# Patient Record
Sex: Male | Born: 1995 | Race: Black or African American | Marital: Single
Health system: Southern US, Community
[De-identification: ages and names within clinical notes are randomized; demographics above are authoritative.]

---

## 2017-02-24 ENCOUNTER — Ambulatory Visit (HOSPITAL_COMMUNITY)

## 2017-02-24 ENCOUNTER — Encounter (HOSPITAL_COMMUNITY): Payer: Self-pay | Admitting: *Deleted

## 2017-02-24 ENCOUNTER — Ambulatory Visit (HOSPITAL_COMMUNITY)
Admission: EM | Admit: 2017-02-24 | Discharge: 2017-02-24 | Disposition: A | Discharge status: Home / Self Care | Attending: Internal Medicine | Admitting: Internal Medicine

## 2017-02-24 ENCOUNTER — Ambulatory Visit (INDEPENDENT_AMBULATORY_CARE_PROVIDER_SITE_OTHER)

## 2017-02-24 DIAGNOSIS — M25571 Pain in right ankle and joints of right foot: Secondary | ICD-10-CM

## 2017-02-24 NOTE — Discharge Instructions (Signed)
I recommend rest, ice, compression of the ankle, elevation, and management of your pain with over-the-counter medicine such as Tylenol or ibuprofen. I have provided a work note, should your symptoms persist, follow-up with her primary care provider or return to clinic as needed.

## 2017-02-24 NOTE — ED Triage Notes (Signed)
inj  r  Ankle  Two  Months  Ago  Hit  It  On a  Table       Did  Not  reinjurr  It    Pt  Reports  Pain on  Weight bearing  /  Running

## 2017-02-24 NOTE — ED Provider Notes (Signed)
CSN: [id number]     Arrival date & time 02/24/17  1416 History   None    Chief Complaint  Patient presents with  . Ankle Pain   (Consider location/radiation/quality/duration/timing/severity/associated sxs/prior Treatment) 21 year old male presents for evaluation of right ankle pain ongoing for approximately 2 months.   The history is provided by the patient.  Ankle Pain  Location:  Ankle Time since incident:  2 months Injury: yes   Mechanism of injury comment:  Twisting Ankle location:  R ankle Pain details:    Quality:  Aching, dull and sharp   Radiates to:  Does not radiate   Severity:  Moderate   Onset quality:  Sudden   Duration:  2 months   Timing:  Intermittent   Progression:  Improving Chronicity:  New Dislocation: no   Foreign body present:  No foreign bodies Prior injury to area:  No Relieved by:  Acetaminophen, compression, heat, elevation and NSAIDs Worsened by:  Bearing weight and exercise Associated symptoms: no back pain, no decreased ROM, no fatigue, no fever, no muscle weakness, no swelling and no tingling   Risk factors: no concern for non-accidental trauma, no frequent fractures, no known bone disorder, no obesity and no recent illness     History reviewed. No pertinent past medical history. History reviewed. No pertinent surgical history. History reviewed. No pertinent family history. Social History  Substance Use Topics  . Smoking status: Not on file  . Smokeless tobacco: Not on file  . Alcohol use Yes     Comment: socially    Review of Systems  Constitutional: Negative for fatigue and fever.  Musculoskeletal: Negative for back pain.  All other systems reviewed and are negative.   Allergies  Patient has no known allergies.  Home Medications   Prior to Admission medications   Not on File   Meds Ordered and Administered this Visit  Medications - No data to display  BP 110/70 (BP Location: Right Arm)   Pulse 78   Temp 98.6 F (37 C)  (Oral)   Resp 18   SpO2 100%  No data found.   Physical Exam  Constitutional: He is oriented to person, place, and time. He appears well-developed and well-nourished. No distress.  HENT:  Head: Normocephalic and atraumatic.  Right Ear: External ear normal.  Left Ear: External ear normal.  Musculoskeletal: He exhibits tenderness. He exhibits no edema or deformity.       Right ankle: He exhibits normal range of motion, no swelling, no deformity, no laceration and normal pulse. Tenderness. Lateral malleolus tenderness found. No medial malleolus, no AITFL, no posterior TFL and no head of 5th metatarsal tenderness found. Achilles tendon exhibits no pain.  Neurological: He is alert and oriented to person, place, and time.  Skin: Skin is warm and dry. Capillary refill takes less than 2 seconds. No rash noted. He is not diaphoretic. No erythema.  Psychiatric: He has a normal mood and affect. His behavior is normal.  Nursing note and vitals reviewed.   Urgent Care Course     Procedures (including critical care time)  Labs Review Labs Reviewed - No data to display  Imaging Review Dg Ankle Complete Right  Result Date: 02/24/2017 CLINICAL DATA:  Right ankle pain since an injury wrestling in January, 2018. Initial encounter. EXAM: RIGHT ANKLE - COMPLETE 3+ VIEW COMPARISON:  None. FINDINGS: There is no evidence of fracture, dislocation, or joint effusion. There is no evidence of arthropathy or other focal bone abnormality. Soft tissues  are unremarkable. IMPRESSION: Normal exam. Electronically Signed   By: Drusilla Kannerhomas  Dalessio M.D.   On: 02/24/2017 15:45       MDM   1. Acute right ankle pain     No evidence of fracture or other abnormal findings on x-ray. Advise rest, ice, compression, elevation, pain management with over-the-counter medicines. Work note provided, follow-up with primary care should symptoms persist.    Dorena BodoLawrence Caylyn Tedeschi, NP 02/24/17 1611

## 2019-05-31 ENCOUNTER — Other Ambulatory Visit: Payer: Self-pay | Admitting: Nurse Practitioner

## 2019-05-31 ENCOUNTER — Ambulatory Visit
Admission: RE | Admit: 2019-05-31 | Discharge: 2019-05-31 | Disposition: A | Discharge status: Home / Self Care | Payer: No Typology Code available for payment source | Source: Ambulatory Visit | Attending: Nurse Practitioner | Admitting: Nurse Practitioner

## 2019-05-31 DIAGNOSIS — Z021 Encounter for pre-employment examination: Secondary | ICD-10-CM

## 2019-12-04 IMAGING — CR CHEST  1 VIEW
1 series · 1 of 1 positions shown · non-contrast
Comparison: None.

CLINICAL DATA: Pre-employment chest x-ray.

EXAM:
CHEST  1 VIEW

[w chest pa]
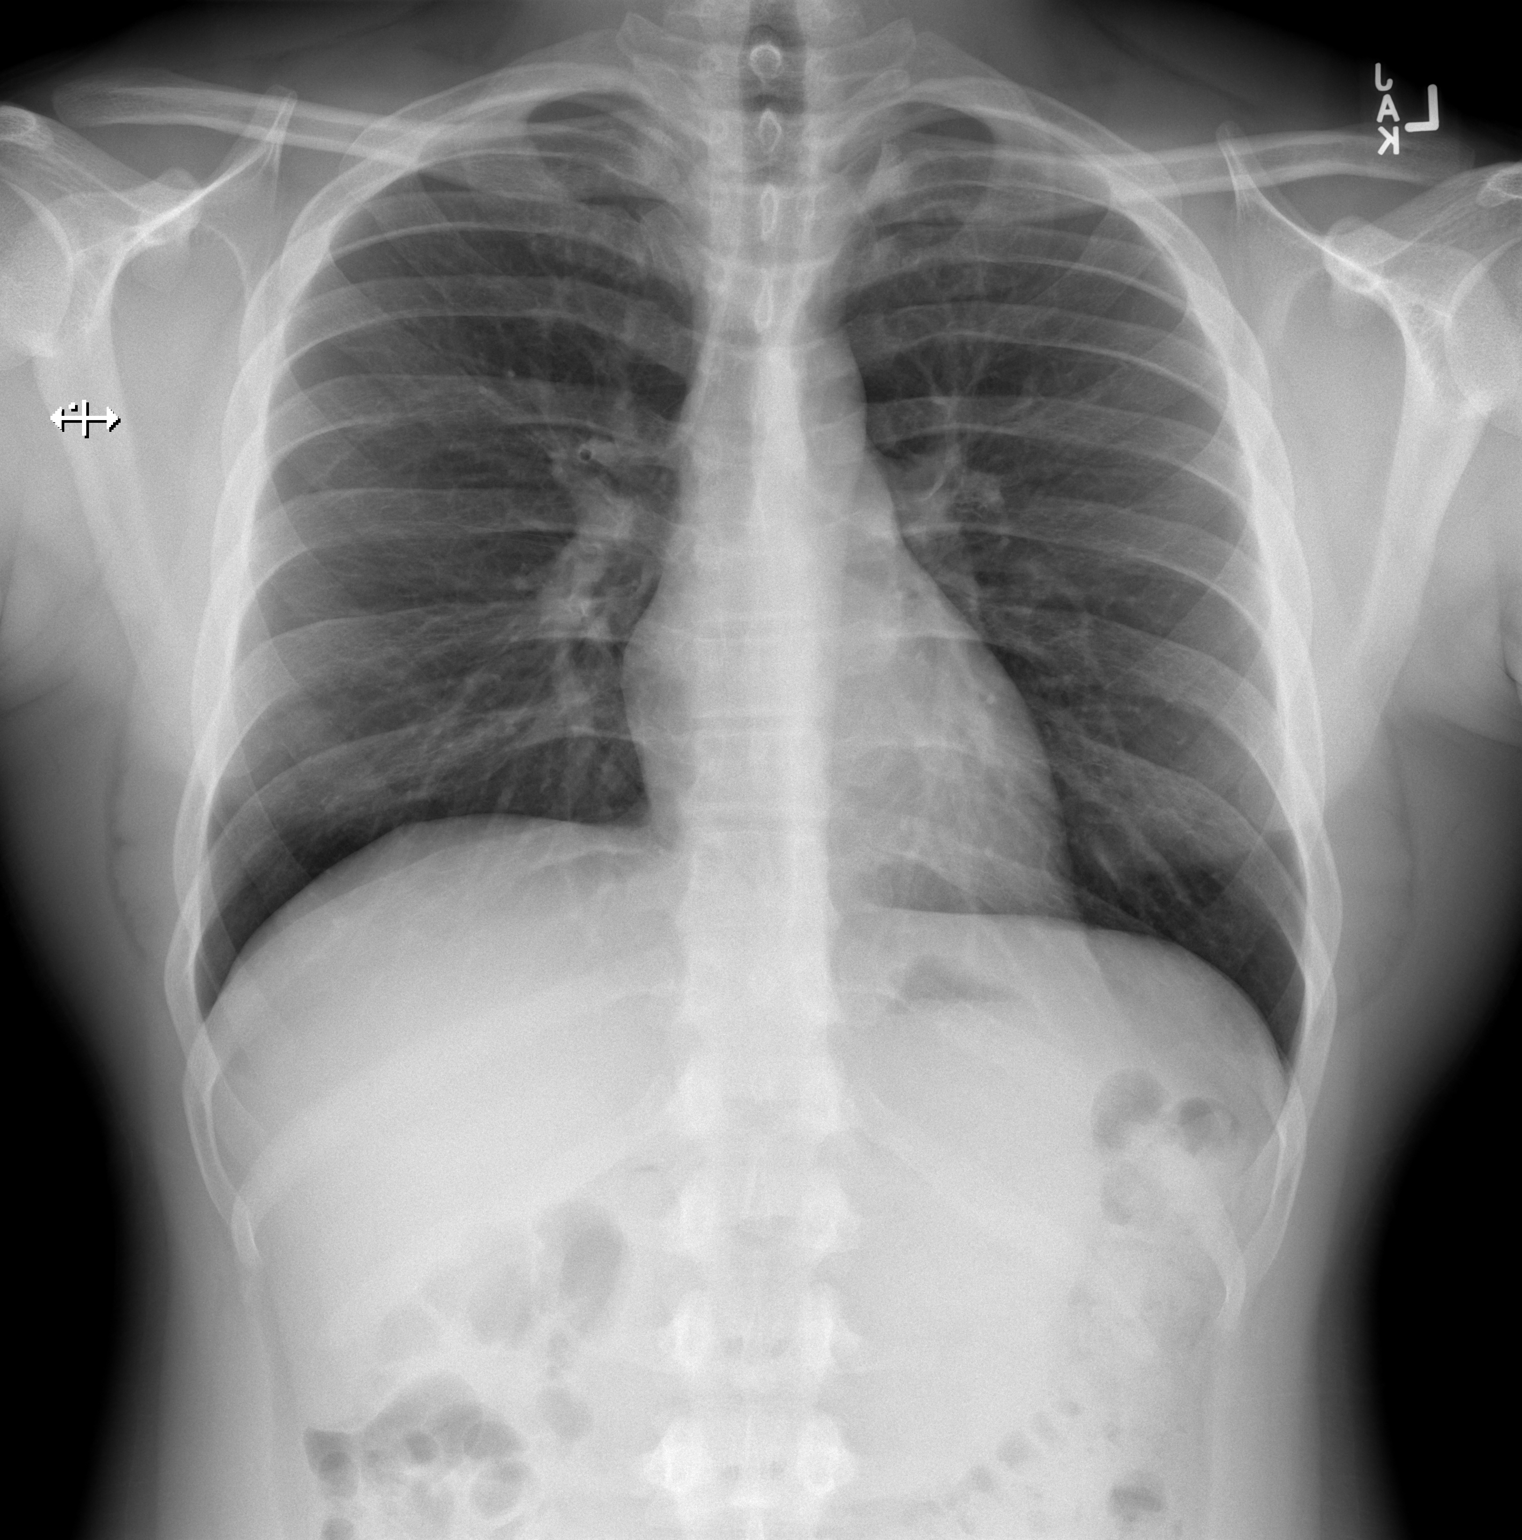

[1 of 1 positions shown; findings below may reference images not displayed]

FINDINGS: The heart size and mediastinal contours are within normal limits.
Both lungs are clear. The visualized skeletal structures are
unremarkable.
IMPRESSION: No active disease.

## 2020-03-28 ENCOUNTER — Ambulatory Visit: Payer: No Typology Code available for payment source | Attending: Family

## 2020-03-28 DIAGNOSIS — Z23 Encounter for immunization: Secondary | ICD-10-CM

## 2020-03-28 NOTE — Progress Notes (Signed)
   Covid-19 Vaccination Clinic  Name:  Abdoul Encinas    MRN: [id number] DOB: 08-14-1996  03/28/2020  Mr. Heber was observed post Covid-19 immunization for 15 minutes without incident. He was provided with Vaccine Information Sheet and instruction to access the V-Safe system.   Mr. Kudrna was instructed to call 911 with any severe reactions post vaccine: Marland Kitchen Difficulty breathing  . Swelling of face and throat  . A fast heartbeat  . A bad rash all over body  . Dizziness and weakness   Immunizations Administered    Name Date Dose VIS Date Route   Moderna COVID-19 Vaccine 03/28/2020 12:39 PM 0.5 mL 11/14/2019 Intramuscular   Manufacturer: Moderna   Lot: [id number]   NDC: 69249-324-19

## 2020-04-30 ENCOUNTER — Ambulatory Visit: Payer: No Typology Code available for payment source | Attending: Family

## 2020-04-30 DIAGNOSIS — Z23 Encounter for immunization: Secondary | ICD-10-CM

## 2020-04-30 NOTE — Progress Notes (Signed)
   Covid-19 Vaccination Clinic  Name:  [name]    MRN: [id number] DOB: 1996-03-15  04/30/2020  Mr. Vaeth was observed post Covid-19 immunization for 15 minutes without incident. He was provided with Vaccine Information Sheet and instruction to access the V-Safe system.   Mr. Mandigo was instructed to call 911 with any severe reactions post vaccine: Marland Kitchen Difficulty breathing  . Swelling of face and throat  . A fast heartbeat  . A bad rash all over body  . Dizziness and weakness   Immunizations Administered    Name Date Dose VIS Date Route   Moderna COVID-19 Vaccine 04/30/2020  1:48 PM 0.5 mL 11/2019 Intramuscular   Manufacturer: Moderna   Lot: [id number]   NDC: 49447-395-84
